# Patient Record
Sex: Male | Born: 1945 | Race: Black or African American | Hispanic: No | Marital: Married | Smoking: Former smoker
Health system: Southern US, Community
[De-identification: ages and names within clinical notes are randomized; demographics above are authoritative.]

## PROBLEM LIST (undated history)

## (undated) DIAGNOSIS — G709 Myoneural disorder, unspecified: Secondary | ICD-10-CM

## (undated) DIAGNOSIS — I10 Essential (primary) hypertension: Secondary | ICD-10-CM

## (undated) DIAGNOSIS — E119 Type 2 diabetes mellitus without complications: Secondary | ICD-10-CM

## (undated) DIAGNOSIS — R0600 Dyspnea, unspecified: Secondary | ICD-10-CM

## (undated) DIAGNOSIS — B20 Human immunodeficiency virus [HIV] disease: Secondary | ICD-10-CM

## (undated) DIAGNOSIS — N189 Chronic kidney disease, unspecified: Secondary | ICD-10-CM

## (undated) DIAGNOSIS — Z21 Asymptomatic human immunodeficiency virus [HIV] infection status: Secondary | ICD-10-CM

## (undated) DIAGNOSIS — M1990 Unspecified osteoarthritis, unspecified site: Secondary | ICD-10-CM

## (undated) HISTORY — PX: COLONOSCOPY: SHX174

## (undated) HISTORY — PX: ELBOW SURGERY: SHX618

## (undated) HISTORY — PX: LUMBAR SPINE SURGERY: SHX701

---

## 1997-09-03 ENCOUNTER — Other Ambulatory Visit: Admission: RE | Admit: 1997-09-03 | Discharge: 1997-09-03 | Payer: Self-pay | Admitting: Urology

## 1999-04-26 ENCOUNTER — Ambulatory Visit (HOSPITAL_COMMUNITY)
Admission: RE | Admit: 1999-04-26 | Discharge: 1999-04-26 | Payer: Self-pay | Admitting: Physical Medicine and Rehabilitation

## 1999-04-26 ENCOUNTER — Encounter: Payer: Self-pay | Admitting: Physical Medicine and Rehabilitation

## 2002-08-26 ENCOUNTER — Encounter: Admission: RE | Admit: 2002-08-26 | Discharge: 2002-11-24 | Payer: Self-pay | Admitting: Family Medicine

## 2008-12-16 ENCOUNTER — Encounter
Admission: RE | Admit: 2008-12-16 | Discharge: 2008-12-16 | Payer: Self-pay | Admitting: Physical Medicine and Rehabilitation

## 2010-05-08 IMAGING — CT CT L SPINE W/ CM
3 of 9 series · 6 of 20 positions shown, 7 images · non-contrast
Comparison: None

CLINICAL DATA: Back pain

CT MYELOGRAPHY LUMBAR SPINE
TECHNIQUE: CT imaging of the lumbar spine was performed after
intrathecal contrast administration.  Multiplanar CT image
reconstructions were also generated.

[Series 2: l spine bone · axial · 0.27mm/px · z∈[-20,+47]mm · 2 of 82 slices shown, 3 images]
[im 28/82  soft-tissue]
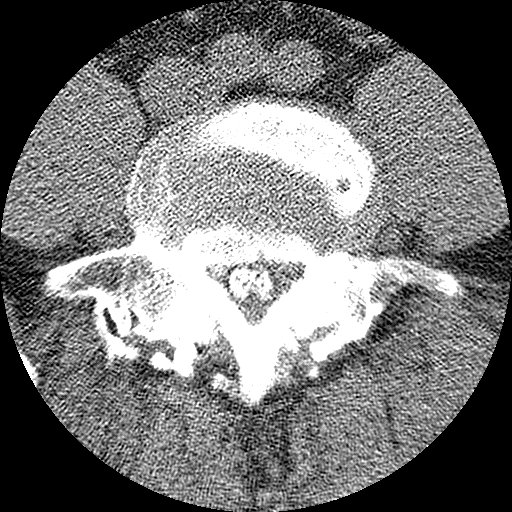
[im 28/82  bone]
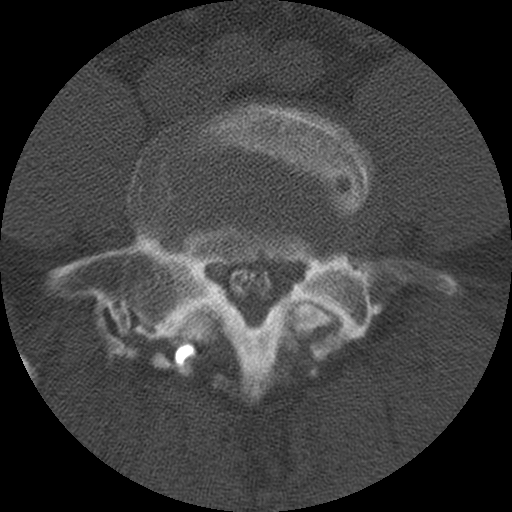
[im 55/82  bone]
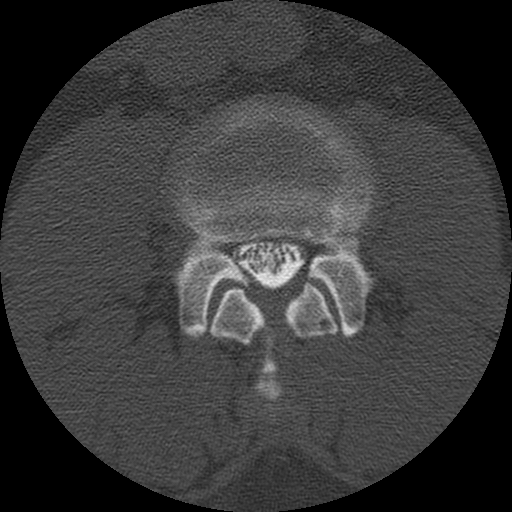

[Series 3: l spine soft · axial · 0.27mm/px · z∈[-38,+62]mm · 3 of 82 slices shown]
[im 21/82  soft-tissue]
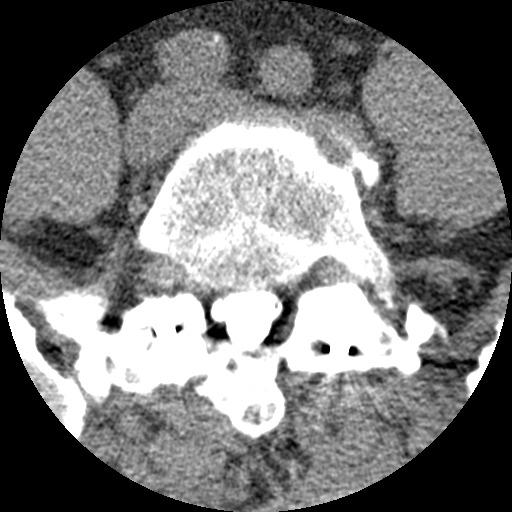
[im 41/82  soft-tissue]
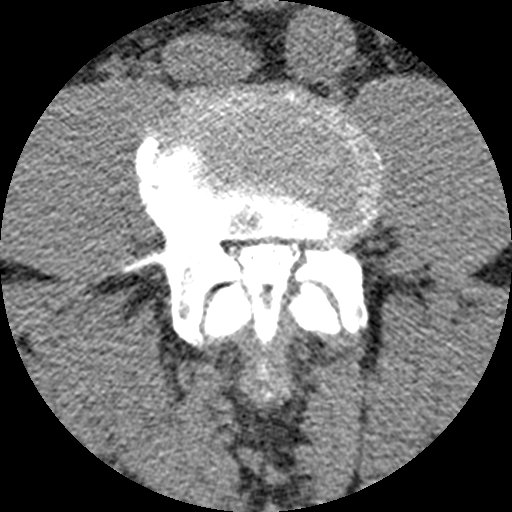
[im 61/82  soft-tissue]
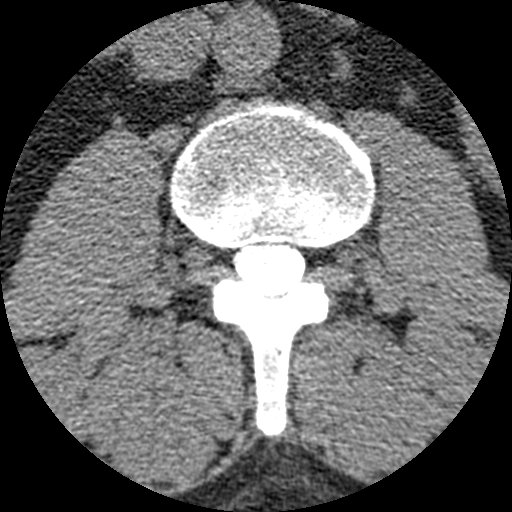

[Series 400: coronal · coronal · 0.41mm/px · 1 of 48 slices shown]
[im 24/48  bone]
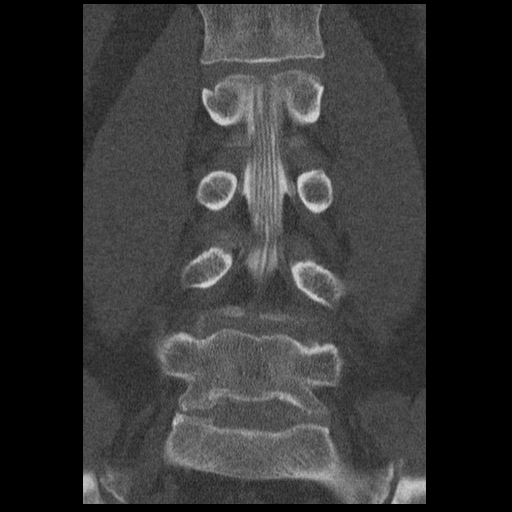

[6 of 20 positions shown; findings below may reference images not displayed]

FINDINGS: 2 mm anterolisthesis at L4-5.  No vertebral height loss.
Conus medullaris terminates at lower L2 vertebral body level.

L1-2:  Unremarkable.

L2-3:  Unremarkable.

L3-4:  Short pedicles.  Right paracentral, foraminal, and
extraforaminal disc protrusion results in moderate right lateral
recess narrowing and L4 nerve root encroachment.  Moderate right
foraminal narrowing as well.  Minimal facet arthropathy.

L4-5:  Ligamentum flavum hypertrophy, significant concentric disc
bulge, and short pedicles result in severe central stenosis.  Mild
by foraminal narrowing.

L5-S1:  Solid fusion across the posterior elements is present.
Minimal posterior disc osteophytes without significant lateral
recess narrowing. Patent foramina.
IMPRESSION: Severe central stenosis at L4-5.

Moderate right lateral recess and foraminal narrowing at L3-4.

Solid posterior fusion at L5-S1 without complication.

## 2013-02-06 DIAGNOSIS — C801 Malignant (primary) neoplasm, unspecified: Secondary | ICD-10-CM

## 2013-02-06 HISTORY — PX: LOBECTOMY: SHX5089

## 2013-02-06 HISTORY — PX: TONSILLECTOMY: SUR1361

## 2013-02-06 HISTORY — DX: Malignant (primary) neoplasm, unspecified: C80.1

## 2017-04-23 NOTE — Progress Notes (Deleted)
[facility] Note  04/24/2017     CHIEF COMPLAINT Patient presents for No chief complaint on file.   HISTORY OF PRESENT ILLNESS: Levi Koch is a 72 y.o. male who presents to the clinic today for:     Referring physician: [facility], [address] [place], [place] [id number]  HISTORICAL INFORMATION:   Selected notes from the [place] Referred by [facility] for DM exam;  LEE-  Ocular Hx-  PMH- DM (A1C 6.9 on 02.22.19), PTSD, hx lung ca, HIV+, HTN,     CURRENT MEDICATIONS: No current outpatient medications on file. (Ophthalmic Drugs)   No current facility-administered medications for this visit.  (Ophthalmic Drugs)   No current outpatient medications on file. (Other)   No current facility-administered medications for this visit.  (Other)      REVIEW OF SYSTEMS:    ALLERGIES Allergies not on file  PAST MEDICAL HISTORY No past medical history on file. *** The histories are not reviewed yet. Please review them in the "History" navigator section and refresh this [place].  FAMILY HISTORY No family history on file.  SOCIAL HISTORY Social History   Tobacco Use   Smoking status: Not on file  Substance Use Topics   Alcohol use: Not on file   Drug use: Not on file         OPHTHALMIC EXAM:   Not recorded      IMAGING AND PROCEDURES  Imaging and Procedures for 04/23/17           ASSESSMENT/PLAN:    ICD-10-CM   1. Retinal edema H35.81 OCT, Retina - OU - Both Eyes    1.  2.  3.  Ophthalmic Meds Ordered this visit:  No orders of the defined types were placed in this encounter.      No Follow-up on file.  There are no Patient Instructions on file for this visit.   Explained the diagnoses, plan, and follow up with the patient and they expressed understanding.  Patient expressed understanding of the importance of proper follow up care.   This document serves as a record  of services personally performed by Gardiner Sleeper, MD, PhD. It was created on their behalf by Catha Brow, [place], a certified ophthalmic assistant. The creation of this record is the provider's dictation and/or activities during the visit.  Electronically signed by: Catha Brow, [place]  04/23/17 4:46 PM    Gardiner Sleeper, M.D., Ph.D. Diseases & Surgery of the Retina and [place] 04/23/17     Abbreviations: M myopia (nearsighted); A astigmatism; H hyperopia (farsighted); P presbyopia; Mrx spectacle prescription;  CTL contact lenses; OD right eye; OS left eye; OU both eyes  XT exotropia; ET esotropia; PEK punctate epithelial keratitis; PEE punctate epithelial erosions; DES dry eye syndrome; MGD meibomian gland dysfunction; ATs artificial tears; PFAT's preservative free artificial tears; [place] nuclear sclerotic cataract; PSC posterior subcapsular cataract; ERM epi-retinal membrane; PVD posterior vitreous detachment; RD retinal detachment; DM diabetes mellitus; DR diabetic retinopathy; NPDR non-proliferative diabetic retinopathy; PDR proliferative diabetic retinopathy; CSME clinically significant macular edema; DME diabetic macular edema; dbh dot blot hemorrhages; CWS cotton wool spot; POAG primary open angle glaucoma; C/D cup-to-disc ratio; HVF humphrey visual field; GVF goldmann visual field; OCT optical coherence tomography; IOP intraocular pressure; BRVO Branch retinal vein occlusion; CRVO central retinal vein occlusion; CRAO central retinal artery occlusion; BRAO branch retinal artery occlusion; RT retinal tear; SB scleral buckle;  PPV pars plana vitrectomy; VH Vitreous hemorrhage; PRP panretinal laser photocoagulation; IVK intravitreal kenalog; VMT vitreomacular traction; MH Macular hole;  NVD neovascularization of the disc; NVE neovascularization elsewhere; AREDS age related eye disease study; ARMD age related macular degeneration; POAG primary open angle  glaucoma; EBMD epithelial/anterior basement membrane dystrophy; ACIOL anterior chamber intraocular lens; IOL intraocular lens; PCIOL posterior chamber intraocular lens; Phaco/IOL phacoemulsification with intraocular lens placement; [place] photorefractive keratectomy; LASIK laser assisted in situ keratomileusis; HTN hypertension; DM diabetes mellitus; COPD chronic obstructive pulmonary disease

## 2017-04-24 ENCOUNTER — Encounter (INDEPENDENT_AMBULATORY_CARE_PROVIDER_SITE_OTHER): Payer: Self-pay | Admitting: Ophthalmology

## 2022-03-14 ENCOUNTER — Other Ambulatory Visit: Payer: Self-pay | Admitting: Neurosurgery

## 2022-04-05 ENCOUNTER — Encounter (HOSPITAL_COMMUNITY)
Admission: RE | Admit: 2022-04-05 | Discharge: 2022-04-05 | Disposition: A | Discharge status: Home / Self Care | Source: Ambulatory Visit | Attending: Neurosurgery | Admitting: Neurosurgery

## 2022-04-05 ENCOUNTER — Encounter (HOSPITAL_COMMUNITY): Payer: Self-pay

## 2022-04-05 ENCOUNTER — Other Ambulatory Visit: Payer: Self-pay

## 2022-04-05 VITALS — BP 155/81 | HR 97 | Temp 97.7°F | Resp 19 | Ht 75.0 in | Wt 265.9 lb

## 2022-04-05 DIAGNOSIS — E1122 Type 2 diabetes mellitus with diabetic chronic kidney disease: Secondary | ICD-10-CM | POA: Insufficient documentation

## 2022-04-05 DIAGNOSIS — Z01818 Encounter for other preprocedural examination: Secondary | ICD-10-CM | POA: Diagnosis present

## 2022-04-05 DIAGNOSIS — Z85118 Personal history of other malignant neoplasm of bronchus and lung: Secondary | ICD-10-CM | POA: Diagnosis not present

## 2022-04-05 DIAGNOSIS — I129 Hypertensive chronic kidney disease with stage 1 through stage 4 chronic kidney disease, or unspecified chronic kidney disease: Secondary | ICD-10-CM | POA: Insufficient documentation

## 2022-04-05 DIAGNOSIS — E119 Type 2 diabetes mellitus without complications: Secondary | ICD-10-CM

## 2022-04-05 HISTORY — DX: Chronic kidney disease, unspecified: N18.9

## 2022-04-05 HISTORY — DX: Type 2 diabetes mellitus without complications: E11.9

## 2022-04-05 HISTORY — DX: Essential (primary) hypertension: I10

## 2022-04-05 HISTORY — DX: Unspecified osteoarthritis, unspecified site: M19.90

## 2022-04-05 HISTORY — DX: Myoneural disorder, unspecified: G70.9

## 2022-04-05 HISTORY — DX: Dyspnea, unspecified: R06.00

## 2022-04-05 HISTORY — DX: Human immunodeficiency virus (HIV) disease: B20

## 2022-04-05 HISTORY — DX: Asymptomatic human immunodeficiency virus (hiv) infection status: Z21

## 2022-04-05 LAB — BASIC METABOLIC PANEL
Anion gap: 9 (ref 5–15)
BUN: 30 mg/dL — ABNORMAL HIGH (ref 8–23)
CO2: 27 mmol/L (ref 22–32)
Calcium: 9.5 mg/dL (ref 8.9–10.3)
Chloride: 101 mmol/L (ref 98–111)
Creatinine, Ser: 2.97 mg/dL — ABNORMAL HIGH (ref 0.61–1.24)
GFR, Estimated: 21 mL/min — ABNORMAL LOW (ref >60)
Glucose, Bld: 197 mg/dL — ABNORMAL HIGH (ref 70–99)
Potassium: 4.1 mmol/L (ref 3.5–5.1)
Sodium: 137 mmol/L (ref 135–145)

## 2022-04-05 LAB — SURGICAL PCR SCREEN
MRSA, PCR: NEGATIVE
Staphylococcus aureus: NEGATIVE

## 2022-04-05 LAB — CBC
HCT: 39.3 % (ref 39.0–52.0)
Hemoglobin: 12.9 g/dL — ABNORMAL LOW (ref 13.0–17.0)
MCH: 36.1 pg — ABNORMAL HIGH (ref 26.0–34.0)
MCHC: 32.8 g/dL (ref 30.0–36.0)
MCV: 110.1 fL — ABNORMAL HIGH (ref 80.0–100.0)
Platelets: 168 10*3/uL (ref 150–400)
RBC: 3.57 MIL/uL — ABNORMAL LOW (ref 4.22–5.81)
RDW: 15.4 % (ref 11.5–15.5)
WBC: 9.6 10*3/uL (ref 4.0–10.5)
nRBC: 0 % (ref 0.0–0.2)

## 2022-04-05 LAB — GLUCOSE, CAPILLARY: Glucose-Capillary: 192 mg/dL — ABNORMAL HIGH (ref 70–99)

## 2022-04-05 NOTE — Progress Notes (Signed)
PCP - Dr. Delene Ruffini [facility]) Cardiologist - Denies  PPM/ICD - Denies Device Orders - n/a Rep Notified - n/a  Chest x-ray - n/a EKG - 04/05/2022 Stress Test - Denies ECHO - Denies Cardiac Cath - Denies  Sleep Study - Denies CPAP - n/a  Pt is DM2. He checks his blood sugar about one per week. Normal fasting range is 136-140. CBG at pre-op appointment was 192. For lunch, pt had a bacon burger, fries, and an orange soda  Last dose of GLP1 agonist- n/a GLP1 instructions: n/a  Blood Thinner Instructions: n/a Aspirin Instructions: Pt was told to hold ASA 5-7 days. His last dose was February 27th  NPO  COVID TEST- n/a   Anesthesia review: Yes. Pt is new to [facility]. Records requested from PCP: Last Office Note, most recent lab work, most recent EKG tracing, any cardiac testing completed that is available.  Patient denies shortness of breath, fever, cough and chest pain at PAT appointment. Pt denies any respiratory illness/infection in the last two months.   All instructions explained to the patient, with a verbal understanding of the material. Patient agrees to go over the instructions while at home for a better understanding. Patient also instructed to self quarantine after being tested for COVID-19. The opportunity to ask questions was provided.

## 2022-04-05 NOTE — Pre-Procedure Instructions (Signed)
Surgical Instructions    Your procedure is scheduled on April 10, 2022.  Report to [facility] Main Entrance "A" at 8:00 A.M., then check in with the Admitting office.  Call this number if you have problems the morning of surgery:  [phone]  If you have any questions prior to your surgery date call [phone]: Open Monday-Friday 8am-4pm If you experience any cold or flu symptoms such as cough, fever, chills, shortness of breath, etc. between now and your scheduled surgery, please notify us at the above number.     Remember:  Do not eat or drink after midnight the night before your surgery      Take these medicines the morning of surgery with A SIP OF WATER:  AMLODIPINE  BIKTARVY  CARVEDILOL  CERITIZINE  FLUTICAS INHL DISK  GABAPENTIN  KETOTIFEN Eye Drops  TIOTROPIUM    May take these medicines IF NEEDED:  CARBOXYMETHYCELLULOSE Eye Drops  HYDROXYZINE    STOP taking aspirin starting today, February 28th.   As of today, STOP taking any Aleve, Naproxen, Ibuprofen, Motrin, Advil, Goody's, BC's, all herbal medications, fish oil, and all vitamins.    WHAT DO I DO ABOUT MY DIABETES MEDICATION?   STOP taking your EMPAGLIFLOZIN (Jardiance) three days prior to surgery. Your last dose will be February 29th.  DO NOT take your GLIPIZIDE the evening before surgery or the morning of surgery.   HOW TO MANAGE YOUR DIABETES BEFORE AND AFTER SURGERY  Why is it important to control my blood sugar before and after surgery? Improving blood sugar levels before and after surgery helps healing and can limit problems. A way of improving blood sugar control is eating a healthy diet by:  Eating less sugar and carbohydrates  Increasing activity/exercise  Talking with your doctor about reaching your blood sugar goals High blood sugars (greater than 180 mg/dL) can raise your risk of infections and slow your recovery, so you will need to focus on controlling your diabetes during the  weeks before surgery. Make sure that the doctor who takes care of your diabetes knows about your planned surgery including the date and location.  How do I manage my blood sugar before surgery? Check your blood sugar at least 4 times a day, starting 2 days before surgery, to make sure that the level is not too high or low.  Check your blood sugar the morning of your surgery when you wake up and every 2 hours until you get to the Short Stay unit.  If your blood sugar is less than 70 mg/dL, you will need to treat for low blood sugar: Do not take insulin. Treat a low blood sugar (less than 70 mg/dL) with  cup of clear juice (cranberry or apple), 4 glucose tablets, OR glucose gel. Recheck blood sugar in 15 minutes after treatment (to make sure it is greater than 70 mg/dL). If your blood sugar is not greater than 70 mg/dL on recheck, call [phone] for further instructions. Report your blood sugar to the short stay nurse when you get to Short Stay.  If you are admitted to the hospital after surgery: Your blood sugar will be checked by the staff and you will probably be given insulin after surgery (instead of oral diabetes medicines) to make sure you have good blood sugar levels. The goal for blood sugar control after surgery is 80-180 mg/dL.                      Do NOT  Smoke (Tobacco/Vaping) for 24 hours prior to your procedure.  If you use a CPAP at night, you may bring your mask/headgear for your overnight stay.   Contacts, glasses, piercing's, hearing aid's, dentures or partials may not be worn into surgery, please bring cases for these belongings.    For patients admitted to the hospital, discharge time will be determined by your treatment team.   Patients discharged the day of surgery will not be allowed to drive home, and someone needs to stay with them for 24 hours.  SURGICAL WAITING ROOM VISITATION Patients having surgery or a procedure may have no more than 2 support people in  the waiting area - these visitors may rotate.   Children under the age of 84 must have an adult with them who is not the patient. If the patient needs to stay at the hospital during part of their recovery, the visitor guidelines for inpatient rooms apply. Pre-op nurse will coordinate an appropriate time for 1 support person to accompany patient in pre-op.  This support person may not rotate.   Please refer to the [facility] website for the visitor guidelines for Inpatients (after your surgery is over and you are in a regular room).    Special instructions:   [place]- Preparing For Surgery  Before surgery, you can play an important role. Because skin is not sterile, your skin needs to be as free of germs as possible. You can reduce the number of germs on your skin by washing with CHG (chlorahexidine gluconate) Soap before surgery.  CHG is an antiseptic cleaner which kills germs and bonds with the skin to continue killing germs even after washing.    Oral Hygiene is also important to reduce your risk of infection.  Remember - BRUSH YOUR TEETH THE MORNING OF SURGERY WITH YOUR REGULAR TOOTHPASTE  Please do not use if you have an allergy to CHG or antibacterial soaps. If your skin becomes reddened/irritated stop using the CHG.  Do not shave (including legs and underarms) for at least 48 hours prior to first CHG shower. It is OK to shave your face.  Please follow these instructions carefully.   Shower the NIGHT BEFORE SURGERY and the MORNING OF SURGERY  If you chose to wash your hair, wash your hair first as usual with your normal shampoo.  After you shampoo, rinse your hair and body thoroughly to remove the shampoo.  Use CHG Soap as you would any other liquid soap. You can apply CHG directly to the skin and wash gently with a scrungie or a clean washcloth.   Apply the CHG Soap to your body ONLY FROM THE NECK DOWN.  Do not use on open wounds or open sores. Avoid contact with your eyes,  ears, mouth and genitals (private parts). Wash Face and genitals (private parts)  with your normal soap.   Wash thoroughly, paying special attention to the area where your surgery will be performed.  Thoroughly rinse your body with warm water from the neck down.  DO NOT shower/wash with your normal soap after using and rinsing off the CHG Soap.  Pat yourself dry with a CLEAN TOWEL.  Wear CLEAN PAJAMAS to bed the night before surgery  Place CLEAN SHEETS on your bed the night before your surgery  DO NOT SLEEP WITH PETS.   Day of Surgery: Take a shower with CHG soap. Do not wear jewelry or makeup Do not wear lotions, powders, perfumes/colognes, or deodorant. Do not shave 48 hours prior  to surgery.  Men may shave face and neck. Do not bring valuables to the hospital.  [facility] is not responsible for any belongings or valuables. Do not wear nail polish, gel polish, artificial nails, or any other type of covering on natural nails (fingers and toes) If you have artificial nails or gel coating that need to be removed by a nail salon, please have this removed prior to surgery. Artificial nails or gel coating may interfere with anesthesia's ability to adequately monitor your vital signs.  Wear Clean/Comfortable clothing the morning of surgery Remember to brush your teeth WITH YOUR REGULAR TOOTHPASTE.   Please read over the following fact sheets that you were given.    If you received a COVID test during your pre-op visit  it is requested that you wear a mask when out in public, stay away from anyone that may not be feeling well and notify your [place] if you develop symptoms. If you have been in contact with anyone that has tested positive in the last 10 days please notify you [place].

## 2022-04-06 ENCOUNTER — Encounter (HOSPITAL_COMMUNITY): Payer: Self-pay

## 2022-04-06 LAB — HEMOGLOBIN A1C
Hgb A1c MFr Bld: 6.8 % — ABNORMAL HIGH (ref 4.8–5.6)
Mean Plasma Glucose: 148 mg/dL

## 2022-04-06 NOTE — Anesthesia Preprocedure Evaluation (Addendum)
Anesthesia Evaluation  Patient identified by MRN, date of birth, ID band Patient awake    Reviewed: Allergy & Precautions, NPO status , Patient's Chart, lab work & pertinent test results, reviewed documented beta blocker date and time   History of Anesthesia Complications Negative for: history of anesthetic complications  Airway Mallampati: II  TM Distance: >3 FB Neck ROM: Full    Dental no notable dental hx. (+) Dental Advisory Given   Pulmonary former smoker Hx lung Ca, s/p thoracotomy   Pulmonary exam normal        Cardiovascular hypertension, Pt. on medications Normal cardiovascular exam     Neuro/Psych negative neurological ROS     GI/Hepatic negative GI ROS, Neg liver ROS,,,  Endo/Other  diabetes    Renal/GU Renal InsufficiencyRenal disease     Musculoskeletal negative musculoskeletal ROS (+)    Abdominal   Peds  Hematology  (+) HIV  Anesthesia Other Findings   Reproductive/Obstetrics                             Anesthesia Physical Anesthesia Plan  ASA: 3  Anesthesia Plan: General   Post-op Pain Management: Tylenol PO (pre-op)* and Toradol IV (intra-op)*   Induction: Intravenous  PONV Risk Score and Plan: 3 and Ondansetron, Dexamethasone and Diphenhydramine  Airway Management Planned: Oral ETT  Additional Equipment:   Intra-op Plan:   Post-operative Plan: Extubation in OR  Informed Consent: I have reviewed the patients History and Physical, chart, labs and discussed the procedure including the risks, benefits and alternatives for the proposed anesthesia with the patient or authorized representative who has indicated his/her understanding and acceptance.     Dental advisory given  Plan Discussed with: Anesthesiologist and CRNA  Anesthesia Plan Comments: (PAT note written 04/06/2022 by Myra Gianotti, PA-C.  )       Anesthesia Quick Evaluation

## 2022-04-06 NOTE — Progress Notes (Signed)
Anesthesia Chart Review:  Case: [id number] Date/Time: 04/10/22 0948   Procedure: Sublaminar decompression - L3-L4 - L4-L5 (Back) - 3C   Anesthesia type: General   Pre-op diagnosis: Stenosis   Location: [place] OR ROOM 18 / [place] OR   Surgeons: Kary Kos, MD       DISCUSSION: Patient is a 77 year old male scheduled for the above procedure.  History includes former smoker (quit 02/06/13), HTN, DM2, CKD (stage IV), HIV (on treatment since at least 2017), neuropathy (legs), lung cancer (SCC s/p LL lobectomy 2016, SBRT to RUL and LUL 05/2017), dyspnea, arthritis, spinal surgery.    He primarily received medical care through the [facility] and [place] locations. See PROVIDERS below for summary of last noted office visits with primary care, ID, and nephrology. HIV has been well controlled on Biktarvy. CKD stage IV has been stable and 04/05/22 Creatinine of 2.97 appears consistent with prior results. CT Chest on 11/22/21 showed stable findings without suspicious findings for local recurrence, or metastatic disease. A1c 6.8%. EKG showed NSR.   She denied chest pain shortness of breath at PAT RN visit.  He denied recent respiratory symptoms.  He reported last ASA 04/04/22.   Anesthesia team to evaluate on the day of surgery.  VS: BP (!) 155/81   Pulse 97   Temp 36.5 C   Resp 19   Ht '6\' 3"'$  (1.905 m)   Wt 120.6 kg   SpO2 99%   BMI 33.24 kg/m    PROVIDERS: Reginia Naas, MD is PCP. Last visit noted is from 08/30/21. Routine six month follow-up planned [facility])  Susa Raring, DO ID 09/26/21 [facility]). Last visit noted was on 09/26/21 for routine HIV follow-up. He had good tolerance of Biktary (TAF/FTC/BIC) with good virologic  suppression and stable immune constitution. Bactrim OI PPx was stopped  in August 2022 due to improved CD4 counts. Continue current ART and routine monitoring of HIV labs.   Renee Harder, MD is nephrologist Last visit noted is from 04/27/21. Creatinine 2.8 in January 2023.  Declining proteinuria. HTN and anemia stable. Given overall good stability of Creatinine over 4-5 years, six month follow-up planned.     LABS: Preoperative labs noted. BUN 30, Creatinine 2.97, eGFR 21 which is consistent with labs from the [place] (BUN 37, Cr 3.260, eGFR  19 on 03/13/22 and Creatinine 2.750 on 08/30/21. HIV-1 Viral load panel on 03/23/22 showed HIV 1 RNA not detectable, CD3+CD4+T4 [place] cells 387 #/volume (normal [phone]) and 29.76 cells/100 cells (normal 29-59) on 03/13/22. (all labs ordered are listed, but only abnormal results are displayed)  Labs Reviewed  GLUCOSE, CAPILLARY - Abnormal; Notable for the following components:      Result Value   Glucose-Capillary 192 (*)    All other components within normal limits  CBC - Abnormal; Notable for the following components:   RBC 3.57 (*)    Hemoglobin 12.9 (*)    MCV 110.1 (*)    MCH 36.1 (*)    All other components within normal limits  BASIC METABOLIC PANEL - Abnormal; Notable for the following components:   Glucose, Bld 197 (*)    BUN 30 (*)    Creatinine, Ser 2.97 (*)    GFR, Estimated 21 (*)    All other components within normal limits  HEMOGLOBIN A1C - Abnormal; Notable for the following components:   Hgb A1c MFr Bld 6.8 (*)    All other components within normal limits  SURGICAL PCR SCREEN     IMAGES: MRI  L-spine 12/08/21: Images in PACS/Can[facility].   CT Chest 11/22/21 [facility]): Impression: 1. Stable chest CT following left lower lobectomy and thoracic  radiation treatment. No interval findings suspicious for local  recurrence, or metastatic disease.    EKG: 04/05/22: NSR   CV: N/A  Past Medical History:  Diagnosis Date   Arthritis    Cancer ([place]) 2015   Lung Cancer   CKD (chronic kidney disease)    stage IV, followed at the [facility] (04/27/21)   Diabetes mellitus without complication ([place])    Type 2   Dyspnea    HIV (human immunodeficiency virus infection) ([place])    followed by [facility] (09/26/21)    Hypertension    Neuromuscular disorder ([place])    Neuropathy both legs    Past Surgical History:  Procedure Laterality Date   COLONOSCOPY     ELBOW SURGERY Right    in the 1980s   LOBECTOMY Left 2015   LUMBAR SPINE SURGERY     x2. first one 32, unsure about second one   TONSILLECTOMY  2015   Removed prior to lung surgery    MEDICATIONS:  amLODipine (NORVASC) 10 MG tablet   aspirin EC 81 MG tablet   atorvastatin (LIPITOR) 40 MG tablet   bictegravir-emtricitabine-tenofovir AF (BIKTARVY) 50-200-25 MG TABS tablet   carboxymethylcellulose (REFRESH PLUS) 0.5 % SOLN   carvedilol (COREG) 25 MG tablet   cetirizine (ZYRTEC) 10 MG tablet   clobetasol (TEMOVATE) 0.05 % external solution   clobetasol ointment (TEMOVATE) 0.05 %   empagliflozin (JARDIANCE) 25 MG TABS tablet   fluocinonide ointment (LIDEX) 0.05 %   fluticasone-salmeterol (ADVAIR) 100-50 MCG/ACT AEPB   gabapentin (NEURONTIN) 100 MG capsule   glipiZIDE (GLUCOTROL) 10 MG tablet   hydrochlorothiazide (HYDRODIURIL) 25 MG tablet   hydrOXYzine (ATARAX) 10 MG tablet   ketotifen (ZADITOR) 0.025 % ophthalmic solution   mirtazapine (REMERON) 15 MG tablet   sildenafil (VIAGRA) 100 MG tablet   SOAP & CLEANSERS EX   spironolactone (ALDACTONE) 25 MG tablet   Tiotropium Bromide Monohydrate 1.25 MCG/ACT AERS   white petrolatum ointment   No current facility-administered medications for this encounter.    Myra Gianotti, PA-C Surgical Short Stay/Anesthesiology [facility] Phone [phone] [facility] Phone [phone] 04/06/2022 1:45 PM

## 2022-04-10 ENCOUNTER — Ambulatory Visit (HOSPITAL_COMMUNITY)
Admission: RE | Admit: 2022-04-10 | Discharge: 2022-04-11 | Disposition: A | Discharge status: Home / Self Care | Attending: Neurosurgery | Admitting: Neurosurgery

## 2022-04-10 ENCOUNTER — Ambulatory Visit (HOSPITAL_COMMUNITY): Admitting: Vascular Surgery

## 2022-04-10 ENCOUNTER — Encounter (HOSPITAL_COMMUNITY): Payer: Self-pay | Admitting: Neurosurgery

## 2022-04-10 ENCOUNTER — Other Ambulatory Visit: Payer: Self-pay

## 2022-04-10 ENCOUNTER — Ambulatory Visit (HOSPITAL_COMMUNITY)
Admission: RE | Disposition: A | Discharge status: Home / Self Care | Payer: Self-pay | Source: Home / Self Care | Attending: Neurosurgery

## 2022-04-10 ENCOUNTER — Ambulatory Visit (HOSPITAL_BASED_OUTPATIENT_CLINIC_OR_DEPARTMENT_OTHER): Admitting: Anesthesiology

## 2022-04-10 ENCOUNTER — Ambulatory Visit (HOSPITAL_COMMUNITY): Payer: Medicare Other

## 2022-04-10 DIAGNOSIS — Z87891 Personal history of nicotine dependence: Secondary | ICD-10-CM | POA: Insufficient documentation

## 2022-04-10 DIAGNOSIS — I129 Hypertensive chronic kidney disease with stage 1 through stage 4 chronic kidney disease, or unspecified chronic kidney disease: Secondary | ICD-10-CM | POA: Insufficient documentation

## 2022-04-10 DIAGNOSIS — Z08 Encounter for follow-up examination after completed treatment for malignant neoplasm: Secondary | ICD-10-CM | POA: Insufficient documentation

## 2022-04-10 DIAGNOSIS — Z09 Encounter for follow-up examination after completed treatment for conditions other than malignant neoplasm: Secondary | ICD-10-CM | POA: Diagnosis not present

## 2022-04-10 DIAGNOSIS — E1122 Type 2 diabetes mellitus with diabetic chronic kidney disease: Secondary | ICD-10-CM | POA: Insufficient documentation

## 2022-04-10 DIAGNOSIS — I10 Essential (primary) hypertension: Secondary | ICD-10-CM | POA: Diagnosis not present

## 2022-04-10 DIAGNOSIS — Z85118 Personal history of other malignant neoplasm of bronchus and lung: Secondary | ICD-10-CM | POA: Diagnosis not present

## 2022-04-10 DIAGNOSIS — M48061 Spinal stenosis, lumbar region without neurogenic claudication: Secondary | ICD-10-CM | POA: Diagnosis not present

## 2022-04-10 DIAGNOSIS — N189 Chronic kidney disease, unspecified: Secondary | ICD-10-CM | POA: Diagnosis not present

## 2022-04-10 DIAGNOSIS — M5416 Radiculopathy, lumbar region: Secondary | ICD-10-CM | POA: Insufficient documentation

## 2022-04-10 DIAGNOSIS — M48062 Spinal stenosis, lumbar region with neurogenic claudication: Secondary | ICD-10-CM | POA: Diagnosis not present

## 2022-04-10 DIAGNOSIS — Z79899 Other long term (current) drug therapy: Secondary | ICD-10-CM | POA: Diagnosis not present

## 2022-04-10 DIAGNOSIS — E119 Type 2 diabetes mellitus without complications: Secondary | ICD-10-CM | POA: Diagnosis not present

## 2022-04-10 DIAGNOSIS — Z7984 Long term (current) use of oral hypoglycemic drugs: Secondary | ICD-10-CM | POA: Diagnosis not present

## 2022-04-10 DIAGNOSIS — Z21 Asymptomatic human immunodeficiency virus [HIV] infection status: Secondary | ICD-10-CM | POA: Diagnosis not present

## 2022-04-10 HISTORY — PX: LUMBAR LAMINECTOMY/DECOMPRESSION MICRODISCECTOMY: SHX5026

## 2022-04-10 LAB — GLUCOSE, CAPILLARY
Glucose-Capillary: 176 mg/dL — ABNORMAL HIGH (ref 70–99)
Glucose-Capillary: 170 mg/dL — ABNORMAL HIGH (ref 70–99)
Glucose-Capillary: 175 mg/dL — ABNORMAL HIGH (ref 70–99)
Glucose-Capillary: 194 mg/dL — ABNORMAL HIGH (ref 70–99)
Glucose-Capillary: 292 mg/dL — ABNORMAL HIGH (ref 70–99)

## 2022-04-10 SURGERY — LUMBAR LAMINECTOMY/DECOMPRESSION MICRODISCECTOMY 2 LEVELS
Anesthesia: General | Site: Back

## 2022-04-10 MED ORDER — ALUM & MAG HYDROXIDE-SIMETH 200-200-20 MG/5ML PO SUSP: 30.0000 mL | Freq: Four times a day (QID) | ORAL | Status: DC | PRN

## 2022-04-10 MED ORDER — PANTOPRAZOLE SODIUM 40 MG IV SOLR: 40.0000 mg | Freq: Every day | INTRAVENOUS | Status: DC

## 2022-04-10 MED ORDER — PANTOPRAZOLE SODIUM 40 MG PO TBEC
40.0000 mg | DELAYED_RELEASE_TABLET | Freq: Every day | ORAL | Status: DC
  Administered 2022-04-10: 40 mg via ORAL
  Filled 2022-04-10: qty 1

## 2022-04-10 MED ORDER — PROMETHAZINE HCL 25 MG/ML IJ SOLN
6.2500 mg | INTRAMUSCULAR | Status: DC | PRN
  Administered 2022-04-10: 12.5 mg via INTRAVENOUS

## 2022-04-10 MED ORDER — DEXAMETHASONE SODIUM PHOSPHATE 10 MG/ML IJ SOLN
INTRAMUSCULAR | Status: AC
  Filled 2022-04-10: qty 1

## 2022-04-10 MED ORDER — AMLODIPINE BESYLATE 10 MG PO TABS: 10.0000 mg | ORAL_TABLET | Freq: Every day | ORAL | Status: DC

## 2022-04-10 MED ORDER — CEFAZOLIN SODIUM-DEXTROSE 2-4 GM/100ML-% IV SOLN
2.0000 g | INTRAVENOUS | Status: AC
  Administered 2022-04-10: 2 g via INTRAVENOUS
  Filled 2022-04-10: qty 100

## 2022-04-10 MED ORDER — AMISULPRIDE (ANTIEMETIC) 5 MG/2ML IV SOLN
INTRAVENOUS | Status: AC
  Filled 2022-04-10: qty 4

## 2022-04-10 MED ORDER — ONDANSETRON HCL 4 MG/2ML IJ SOLN: 4.0000 mg | Freq: Four times a day (QID) | INTRAMUSCULAR | Status: DC | PRN

## 2022-04-10 MED ORDER — CYCLOBENZAPRINE HCL 10 MG PO TABS
10.0000 mg | ORAL_TABLET | Freq: Three times a day (TID) | ORAL | Status: DC | PRN
  Administered 2022-04-10: 10 mg via ORAL
  Filled 2022-04-10: qty 1

## 2022-04-10 MED ORDER — HYDROXYZINE HCL 50 MG/ML IM SOLN
50.0000 mg | Freq: Four times a day (QID) | INTRAMUSCULAR | Status: DC | PRN
  Filled 2022-04-10: qty 1

## 2022-04-10 MED ORDER — ACETAMINOPHEN 650 MG RE SUPP: 650.0000 mg | RECTAL | Status: DC | PRN

## 2022-04-10 MED ORDER — KETOTIFEN FUMARATE 0.025 % OP SOLN
1.0000 [drp] | Freq: Two times a day (BID) | OPHTHALMIC | Status: DC
  Filled 2022-04-10: qty 5

## 2022-04-10 MED ORDER — FENTANYL CITRATE (PF) 100 MCG/2ML IJ SOLN
25.0000 ug | INTRAMUSCULAR | Status: DC | PRN
  Administered 2022-04-10 (×3): 50 ug via INTRAVENOUS

## 2022-04-10 MED ORDER — MIRTAZAPINE 15 MG PO TABS: 15.0000 mg | ORAL_TABLET | Freq: Every evening | ORAL | Status: DC | PRN

## 2022-04-10 MED ORDER — AMLODIPINE BESYLATE 10 MG PO TABS
10.0000 mg | ORAL_TABLET | Freq: Every day | ORAL | Status: DC
  Administered 2022-04-11: 10 mg via ORAL
  Filled 2022-04-10: qty 1

## 2022-04-10 MED ORDER — THROMBIN (RECOMBINANT) 5000 UNITS EX SOLR: CUTANEOUS | Status: DC | PRN

## 2022-04-10 MED ORDER — CEFAZOLIN SODIUM-DEXTROSE 2-4 GM/100ML-% IV SOLN
2.0000 g | Freq: Three times a day (TID) | INTRAVENOUS | Status: AC
  Administered 2022-04-10 – 2022-04-11 (×2): 2 g via INTRAVENOUS
  Filled 2022-04-10 (×2): qty 100

## 2022-04-10 MED ORDER — FENTANYL CITRATE (PF) 100 MCG/2ML IJ SOLN
INTRAMUSCULAR | Status: AC
  Filled 2022-04-10: qty 2

## 2022-04-10 MED ORDER — KETOTIFEN FUMARATE 0.035 % OP SOLN
1.0000 [drp] | Freq: Two times a day (BID) | OPHTHALMIC | Status: DC
  Filled 2022-04-10: qty 5

## 2022-04-10 MED ORDER — HYDROCODONE-ACETAMINOPHEN 5-325 MG PO TABS
2.0000 | ORAL_TABLET | ORAL | Status: DC | PRN
  Administered 2022-04-10 – 2022-04-11 (×4): 2 via ORAL
  Filled 2022-04-10 (×4): qty 2

## 2022-04-10 MED ORDER — EPHEDRINE 5 MG/ML INJ
INTRAVENOUS | Status: AC
  Filled 2022-04-10: qty 5

## 2022-04-10 MED ORDER — ASPIRIN 81 MG PO TBEC
81.0000 mg | DELAYED_RELEASE_TABLET | Freq: Every day | ORAL | Status: DC
  Administered 2022-04-10 – 2022-04-11 (×2): 81 mg via ORAL
  Filled 2022-04-10 (×2): qty 1

## 2022-04-10 MED ORDER — CLOBETASOL PROPIONATE 0.05 % EX SOLN: 1.0000 | CUTANEOUS | Status: DC

## 2022-04-10 MED ORDER — LORATADINE 10 MG PO TABS
10.0000 mg | ORAL_TABLET | Freq: Every day | ORAL | Status: DC
  Administered 2022-04-10 – 2022-04-11 (×2): 10 mg via ORAL
  Filled 2022-04-10 (×2): qty 1

## 2022-04-10 MED ORDER — DEXAMETHASONE SODIUM PHOSPHATE 10 MG/ML IJ SOLN
INTRAMUSCULAR | Status: DC | PRN
  Administered 2022-04-10: 5 mg via INTRAVENOUS

## 2022-04-10 MED ORDER — LIDOCAINE-EPINEPHRINE 1 %-1:100000 IJ SOLN
INTRAMUSCULAR | Status: AC
  Filled 2022-04-10: qty 1

## 2022-04-10 MED ORDER — FENTANYL CITRATE (PF) 250 MCG/5ML IJ SOLN
INTRAMUSCULAR | Status: AC
  Filled 2022-04-10: qty 5

## 2022-04-10 MED ORDER — CHLORHEXIDINE GLUCONATE CLOTH 2 % EX PADS: 6.0000 | MEDICATED_PAD | Freq: Once | CUTANEOUS | Status: DC

## 2022-04-10 MED ORDER — CLOBETASOL PROPIONATE 0.05 % EX OINT: 1.0000 | TOPICAL_OINTMENT | Freq: Two times a day (BID) | CUTANEOUS | Status: DC | PRN

## 2022-04-10 MED ORDER — LIDOCAINE-EPINEPHRINE 1 %-1:100000 IJ SOLN
INTRAMUSCULAR | Status: DC | PRN
  Administered 2022-04-10: 10 mL

## 2022-04-10 MED ORDER — SPIRONOLACTONE 25 MG PO TABS
25.0000 mg | ORAL_TABLET | Freq: Every day | ORAL | Status: DC
  Administered 2022-04-10 – 2022-04-11 (×2): 25 mg via ORAL
  Filled 2022-04-10 (×2): qty 1

## 2022-04-10 MED ORDER — FENTANYL CITRATE (PF) 250 MCG/5ML IJ SOLN
INTRAMUSCULAR | Status: DC | PRN
  Administered 2022-04-10: 50 ug via INTRAVENOUS
  Administered 2022-04-10: 100 ug via INTRAVENOUS

## 2022-04-10 MED ORDER — HYDROXYZINE HCL 10 MG PO TABS: 10.0000 mg | ORAL_TABLET | Freq: Two times a day (BID) | ORAL | Status: DC | PRN

## 2022-04-10 MED ORDER — POLYVINYL ALCOHOL 1.4 % OP SOLN: 1.0000 [drp] | Freq: Two times a day (BID) | OPHTHALMIC | Status: DC | PRN

## 2022-04-10 MED ORDER — SILDENAFIL CITRATE 100 MG PO TABS: 100.0000 mg | ORAL_TABLET | Freq: Every day | ORAL | Status: DC | PRN

## 2022-04-10 MED ORDER — BICTEGRAVIR-EMTRICITAB-TENOFOV 50-200-25 MG PO TABS
1.0000 | ORAL_TABLET | Freq: Every day | ORAL | Status: DC
  Administered 2022-04-10 – 2022-04-11 (×2): 1 via ORAL
  Filled 2022-04-10 (×2): qty 1

## 2022-04-10 MED ORDER — MOMETASONE FURO-FORMOTEROL FUM 100-5 MCG/ACT IN AERO
2.0000 | INHALATION_SPRAY | Freq: Two times a day (BID) | RESPIRATORY_TRACT | Status: DC
  Filled 2022-04-10: qty 8.8

## 2022-04-10 MED ORDER — PROPOFOL 10 MG/ML IV BOLUS
INTRAVENOUS | Status: DC | PRN
  Administered 2022-04-10: 160 mg via INTRAVENOUS

## 2022-04-10 MED ORDER — LIDOCAINE 2% (20 MG/ML) 5 ML SYRINGE
INTRAMUSCULAR | Status: AC
  Filled 2022-04-10: qty 5

## 2022-04-10 MED ORDER — SODIUM CHLORIDE 0.9 % IV SOLN: INTRAVENOUS | Status: DC

## 2022-04-10 MED ORDER — PHENYLEPHRINE 80 MCG/ML (10ML) SYRINGE FOR IV PUSH (FOR BLOOD PRESSURE SUPPORT)
PREFILLED_SYRINGE | INTRAVENOUS | Status: DC | PRN
  Administered 2022-04-10: 80 ug via INTRAVENOUS

## 2022-04-10 MED ORDER — PROMETHAZINE HCL 25 MG/ML IJ SOLN
INTRAMUSCULAR | Status: AC
  Filled 2022-04-10: qty 1

## 2022-04-10 MED ORDER — 0.9 % SODIUM CHLORIDE (POUR BTL) OPTIME
TOPICAL | Status: DC | PRN
  Administered 2022-04-10: 1000 mL

## 2022-04-10 MED ORDER — THROMBIN 5000 UNITS EX SOLR
CUTANEOUS | Status: AC
  Filled 2022-04-10: qty 10000

## 2022-04-10 MED ORDER — BUPIVACAINE HCL (PF) 0.25 % IJ SOLN
INTRAMUSCULAR | Status: DC | PRN
  Administered 2022-04-10: 10 mL

## 2022-04-10 MED ORDER — ONDANSETRON HCL 4 MG/2ML IJ SOLN
INTRAMUSCULAR | Status: DC | PRN
  Administered 2022-04-10: 4 mg via INTRAVENOUS

## 2022-04-10 MED ORDER — GABAPENTIN 100 MG PO CAPS
200.0000 mg | ORAL_CAPSULE | Freq: Two times a day (BID) | ORAL | Status: DC
  Administered 2022-04-10 – 2022-04-11 (×2): 200 mg via ORAL
  Filled 2022-04-10 (×2): qty 2

## 2022-04-10 MED ORDER — LACTATED RINGERS IV SOLN: INTRAVENOUS | Status: DC

## 2022-04-10 MED ORDER — CYCLOBENZAPRINE HCL 10 MG PO TABS
ORAL_TABLET | ORAL | Status: AC
  Filled 2022-04-10: qty 1

## 2022-04-10 MED ORDER — CARVEDILOL 12.5 MG PO TABS
12.5000 mg | ORAL_TABLET | Freq: Two times a day (BID) | ORAL | Status: DC
  Administered 2022-04-10 – 2022-04-11 (×2): 12.5 mg via ORAL
  Filled 2022-04-10 (×2): qty 1

## 2022-04-10 MED ORDER — LIDOCAINE 2% (20 MG/ML) 5 ML SYRINGE
INTRAMUSCULAR | Status: DC | PRN
  Administered 2022-04-10: 100 mg via INTRAVENOUS

## 2022-04-10 MED ORDER — ACETAMINOPHEN 325 MG PO TABS: 650.0000 mg | ORAL_TABLET | ORAL | Status: DC | PRN

## 2022-04-10 MED ORDER — INSULIN ASPART 100 UNIT/ML IJ SOLN
0.0000 [IU] | Freq: Three times a day (TID) | INTRAMUSCULAR | Status: DC
  Administered 2022-04-10: 3 [IU] via SUBCUTANEOUS

## 2022-04-10 MED ORDER — ORAL CARE MOUTH RINSE: 15.0000 mL | Freq: Once | OROMUCOSAL | Status: AC

## 2022-04-10 MED ORDER — ROCURONIUM BROMIDE 10 MG/ML (PF) SYRINGE
PREFILLED_SYRINGE | INTRAVENOUS | Status: AC
  Filled 2022-04-10: qty 10

## 2022-04-10 MED ORDER — SODIUM CHLORIDE 0.9 % IV SOLN
250.0000 mL | INTRAVENOUS | Status: DC
  Administered 2022-04-10: 250 mL via INTRAVENOUS

## 2022-04-10 MED ORDER — SUGAMMADEX SODIUM 200 MG/2ML IV SOLN
INTRAVENOUS | Status: DC | PRN
  Administered 2022-04-10: 300 mg via INTRAVENOUS

## 2022-04-10 MED ORDER — ACETAMINOPHEN 500 MG PO TABS
1000.0000 mg | ORAL_TABLET | Freq: Once | ORAL | Status: AC
  Administered 2022-04-10: 1000 mg via ORAL
  Filled 2022-04-10: qty 2

## 2022-04-10 MED ORDER — UMECLIDINIUM BROMIDE 62.5 MCG/ACT IN AEPB
1.0000 | INHALATION_SPRAY | Freq: Every day | RESPIRATORY_TRACT | Status: DC
  Filled 2022-04-10: qty 7

## 2022-04-10 MED ORDER — INSULIN ASPART 100 UNIT/ML IJ SOLN: 0.0000 [IU] | INTRAMUSCULAR | Status: DC | PRN

## 2022-04-10 MED ORDER — EMPAGLIFLOZIN 10 MG PO TABS
10.0000 mg | ORAL_TABLET | Freq: Every day | ORAL | Status: DC
  Administered 2022-04-10 – 2022-04-11 (×2): 10 mg via ORAL
  Filled 2022-04-10 (×2): qty 1

## 2022-04-10 MED ORDER — SODIUM CHLORIDE 0.9% FLUSH: 3.0000 mL | Freq: Two times a day (BID) | INTRAVENOUS | Status: DC

## 2022-04-10 MED ORDER — AMISULPRIDE (ANTIEMETIC) 5 MG/2ML IV SOLN
10.0000 mg | Freq: Once | INTRAVENOUS | Status: AC | PRN
  Administered 2022-04-10: 10 mg via INTRAVENOUS

## 2022-04-10 MED ORDER — TIOTROPIUM BROMIDE MONOHYDRATE 1.25 MCG/ACT IN AERS: 2.0000 | INHALATION_SPRAY | Freq: Every day | RESPIRATORY_TRACT | Status: DC

## 2022-04-10 MED ORDER — BUPIVACAINE HCL (PF) 0.25 % IJ SOLN
INTRAMUSCULAR | Status: AC
  Filled 2022-04-10: qty 30

## 2022-04-10 MED ORDER — PROPOFOL 10 MG/ML IV BOLUS
INTRAVENOUS | Status: AC
  Filled 2022-04-10: qty 20

## 2022-04-10 MED ORDER — CHLORHEXIDINE GLUCONATE 0.12 % MT SOLN
15.0000 mL | Freq: Once | OROMUCOSAL | Status: AC
  Administered 2022-04-10: 15 mL via OROMUCOSAL
  Filled 2022-04-10: qty 15

## 2022-04-10 MED ORDER — GLIPIZIDE 10 MG PO TABS
10.0000 mg | ORAL_TABLET | Freq: Two times a day (BID) | ORAL | Status: DC
  Administered 2022-04-10 – 2022-04-11 (×2): 10 mg via ORAL
  Filled 2022-04-10 (×3): qty 1

## 2022-04-10 MED ORDER — INSULIN ASPART 100 UNIT/ML IJ SOLN: 0.0000 [IU] | Freq: Three times a day (TID) | INTRAMUSCULAR | Status: DC

## 2022-04-10 MED ORDER — MENTHOL 3 MG MT LOZG: 1.0000 | LOZENGE | OROMUCOSAL | Status: DC | PRN

## 2022-04-10 MED ORDER — ROCURONIUM BROMIDE 10 MG/ML (PF) SYRINGE
PREFILLED_SYRINGE | INTRAVENOUS | Status: DC | PRN
  Administered 2022-04-10: 70 mg via INTRAVENOUS
  Administered 2022-04-10: 10 mg via INTRAVENOUS

## 2022-04-10 MED ORDER — HYDROCHLOROTHIAZIDE 25 MG PO TABS
25.0000 mg | ORAL_TABLET | Freq: Every day | ORAL | Status: DC
  Administered 2022-04-10: 25 mg via ORAL
  Filled 2022-04-10 (×2): qty 1

## 2022-04-10 MED ORDER — PHENOL 1.4 % MT LIQD: 1.0000 | OROMUCOSAL | Status: DC | PRN

## 2022-04-10 MED ORDER — ONDANSETRON HCL 4 MG PO TABS: 4.0000 mg | ORAL_TABLET | Freq: Four times a day (QID) | ORAL | Status: DC | PRN

## 2022-04-10 MED ORDER — SODIUM CHLORIDE 0.9% FLUSH: 3.0000 mL | INTRAVENOUS | Status: DC | PRN

## 2022-04-10 MED ORDER — ONDANSETRON HCL 4 MG/2ML IJ SOLN
INTRAMUSCULAR | Status: AC
  Filled 2022-04-10: qty 2

## 2022-04-10 MED ORDER — HYDROMORPHONE HCL 1 MG/ML IJ SOLN: 0.5000 mg | INTRAMUSCULAR | Status: DC | PRN

## 2022-04-10 MED ORDER — ATORVASTATIN CALCIUM 40 MG PO TABS
40.0000 mg | ORAL_TABLET | Freq: Every day | ORAL | Status: DC
  Administered 2022-04-10: 40 mg via ORAL
  Filled 2022-04-10: qty 1

## 2022-04-10 MED ORDER — FLUOCINONIDE 0.05 % EX OINT: 1.0000 | TOPICAL_OINTMENT | Freq: Two times a day (BID) | CUTANEOUS | Status: DC | PRN

## 2022-04-10 SURGICAL SUPPLY — 55 items
ADH SKN CLS APL DERMABOND .7 (GAUZE/BANDAGES/DRESSINGS) ×1
APL SKNCLS STERI-STRIP NONHPOA (GAUZE/BANDAGES/DRESSINGS) ×1
BAG COUNTER SPONGE SURGICOUNT (BAG) ×1 IMPLANT
BAG SPNG CNTER NS LX DISP (BAG) ×1
BAND INSRT 18 STRL LF DISP RB (MISCELLANEOUS) ×2
BAND RUBBER #18 3X1/16 STRL (MISCELLANEOUS) ×2 IMPLANT
BENZOIN TINCTURE PRP APPL 2/3 (GAUZE/BANDAGES/DRESSINGS) ×1 IMPLANT
BLADE CLIPPER SURG (BLADE) IMPLANT
BLADE SURG 11 STRL SS (BLADE) ×1 IMPLANT
BUR CUTTER 7.0 ROUND (BURR) ×1 IMPLANT
BUR MATCHSTICK NEURO 3.0 LAGG (BURR) ×1 IMPLANT
CANISTER SUCT 3000ML PPV (MISCELLANEOUS) ×1 IMPLANT
DERMABOND ADVANCED .7 DNX12 (GAUZE/BANDAGES/DRESSINGS) ×1 IMPLANT
DRAPE HALF SHEET 40X57 (DRAPES) IMPLANT
DRAPE LAPAROTOMY 100X72X124 (DRAPES) ×1 IMPLANT
DRAPE MICROSCOPE SLANT 54X150 (MISCELLANEOUS) ×1 IMPLANT
DRAPE SURG 17X23 STRL (DRAPES) ×1 IMPLANT
DRSG OPSITE 4X5.5 SM (GAUZE/BANDAGES/DRESSINGS) IMPLANT
DRSG OPSITE POSTOP 4X8 (GAUZE/BANDAGES/DRESSINGS) IMPLANT
DURAPREP 26ML APPLICATOR (WOUND CARE) ×1 IMPLANT
ELECT BLADE 4.0 EZ CLEAN MEGAD (MISCELLANEOUS) ×1
ELECT REM PT RETURN 9FT ADLT (ELECTROSURGICAL) ×1
ELECTRODE BLDE 4.0 EZ CLN MEGD (MISCELLANEOUS) IMPLANT
ELECTRODE REM PT RTRN 9FT ADLT (ELECTROSURGICAL) ×1 IMPLANT
EVACUATOR 1/8 PVC DRAIN (DRAIN) IMPLANT
GAUZE 4X4 16PLY ~~LOC~~+RFID DBL (SPONGE) IMPLANT
GAUZE SPONGE 4X4 12PLY STRL (GAUZE/BANDAGES/DRESSINGS) ×1 IMPLANT
GLOVE BIO SURGEON STRL SZ7 (GLOVE) IMPLANT
GLOVE BIO SURGEON STRL SZ8 (GLOVE) ×1 IMPLANT
GLOVE BIOGEL PI IND STRL 7.0 (GLOVE) IMPLANT
GLOVE EXAM NITRILE XL STR (GLOVE) IMPLANT
GLOVE INDICATOR 8.5 STRL (GLOVE) ×2 IMPLANT
GOWN STRL REUS W/ TWL LRG LVL3 (GOWN DISPOSABLE) ×1 IMPLANT
GOWN STRL REUS W/ TWL XL LVL3 (GOWN DISPOSABLE) ×2 IMPLANT
GOWN STRL REUS W/TWL 2XL LVL3 (GOWN DISPOSABLE) IMPLANT
GOWN STRL REUS W/TWL LRG LVL3 (GOWN DISPOSABLE) ×2
GOWN STRL REUS W/TWL XL LVL3 (GOWN DISPOSABLE) ×1
KIT BASIN OR (CUSTOM PROCEDURE TRAY) ×1 IMPLANT
KIT TURNOVER KIT B (KITS) ×1 IMPLANT
NDL SPNL 22GX3.5 QUINCKE BK (NEEDLE) ×1 IMPLANT
NEEDLE HYPO 22GX1.5 SAFETY (NEEDLE) ×1 IMPLANT
NEEDLE SPNL 22GX3.5 QUINCKE BK (NEEDLE) ×1 IMPLANT
NS IRRIG 1000ML POUR BTL (IV SOLUTION) ×1 IMPLANT
PACK LAMINECTOMY NEURO (CUSTOM PROCEDURE TRAY) ×1 IMPLANT
PATTIES SURGICAL .5 X.5 (GAUZE/BANDAGES/DRESSINGS) IMPLANT
SPIKE FLUID TRANSFER (MISCELLANEOUS) ×1 IMPLANT
SPONGE SURGIFOAM ABS GEL SZ50 (HEMOSTASIS) ×1 IMPLANT
STRIP CLOSURE SKIN 1/2X4 (GAUZE/BANDAGES/DRESSINGS) ×1 IMPLANT
SUT VIC AB 0 CT1 18XCR BRD8 (SUTURE) ×1 IMPLANT
SUT VIC AB 0 CT1 8-18 (SUTURE) ×1
SUT VIC AB 2-0 CT1 18 (SUTURE) ×1 IMPLANT
SUT VICRYL 4-0 PS2 18IN ABS (SUTURE) ×1 IMPLANT
TOWEL GREEN STERILE (TOWEL DISPOSABLE) ×1 IMPLANT
TOWEL GREEN STERILE FF (TOWEL DISPOSABLE) ×1 IMPLANT
WATER STERILE IRR 1000ML POUR (IV SOLUTION) ×1 IMPLANT

## 2022-04-10 NOTE — Op Note (Signed)
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  Postoperative diagnosis: Same  Procedure: Decompressive laminectomies L3-4 L4-5 partial medial facetectomies and foraminotomies of the L3, L4 and L5 nerve roots bilaterally.  Buzzelli: Levi Koch.  Assistant: Nash Shearer.  Anesthesia: General.  EBL: Minimal.  HPI: 77 year old gentleman with severe hip and leg pain rating down L4 distribution workup revealed severe spinal stenosis at L3-4 and L4-5.  Due to patient's progression of clinical syndrome imaging findings of a conservative treatment I recommended decompressive laminectomy at those 2 levels.  I extensively reviewed the risks and benefits of the operation with him as well as perioperative course expectations of outcome and alternatives of surgery and he understood and agreed to proceed forward.  Operative procedure: Patient was brought into the OR was induced under general anesthesia positioned prone Wilson frame his back was prepped and draped in routine sterile fashion his old incision was opened up the scar tissue was dissected free subperiosteal dissection was carried lamina of L3-L4 and L5 bilaterally interoperative x-ray confirmed identification of the appropriate level.  Then the spinous process at L3 and L4 was removed central decompression was begun the lamina was thinned out with a high-speed drill and central decompression was begun with a 2 and 3 Miller Kerrison punch.  Marked hourglass compression of thecal sac was causing severe stenosis at 3 4 and 4 5 so using a 4 Penfield the dura was dissected off with a large medially projecting spurs and then the facet joints and lateral gutters were under Bitton with a 2 mm Kerrison punch foraminotomies were performed of the L3-L4 and L5 nerve roots initially on the left and subsequently on the right.  At the end of decompression there is no further stenosis all the  foramina were widely patent the wounds and copiously irrigated meticulous hemostasis was maintained Gelfoam was ON top of the dura a medium Hemovac drain was placed Marcaine was injected into the fascia and the muscle and connective tissue were closed with interrupted Vicryl and a running 4 subcuticular and the skin Dermabond benzoin Steri-Strips and a sterile dressing was applied patient recovery in stable condition.  At the end of the case all needle counts and sponge counts were correct.

## 2022-04-10 NOTE — Anesthesia Procedure Notes (Signed)
Procedure Name: Intubation Date/Time: 04/10/2022 11:22 AM  Performed by: Gaylene Brooks, CRNAPre-anesthesia Checklist: Patient identified, Emergency Drugs available, Suction available and Patient being monitored Patient Re-evaluated:Patient Re-evaluated prior to induction Oxygen Delivery Method: Circle System Utilized Preoxygenation: Pre-oxygenation with 100% oxygen Induction Type: IV induction Ventilation: Mask ventilation without difficulty Laryngoscope Size: Miller and 2 Grade View: Grade II Tube type: Oral Tube size: 7.5 mm Number of attempts: 1 Airway Equipment and Method: Stylet and Oral airway Placement Confirmation: ETT inserted through vocal cords under direct vision, positive ETCO2 and breath sounds checked- equal and bilateral Secured at: 22 cm Tube secured with: Tape Dental Injury: Teeth and Oropharynx as per pre-operative assessment

## 2022-04-10 NOTE — H&P (Signed)
Levi Koch is an 77 y.o. male.   Chief Complaint: Hip and leg pain neurogenic claudication HPI: 77 year old gentleman with neurogenic claudication and hip and leg pain consistent with primarily L4 distribution workup showed severe spinal stenosis at L3-4 and L4-5 he has previously gone solid L5-S1 trans facet fusion however he is got no back pain and no evidence of overt instability and progressive worsening claudication.  Due to his progression of clinical syndrome imaging findings for conservative treatment I have recommended decompressive laminectomies at L3-4 and through the superior aspect of 4 5.  I extensively the risks and benefits including the fact that we are doing this above the level of the previous fusion and there is some risk however no evident overt evidence of instability and with claudication being his predominant symptom I think that we can give the decompression a chance due to medical comorbidities it is the best option I think for him.  Risks benefits perioperative course expectations of outcome and alternatives were all explained extensively he understands  Past Medical History:  Diagnosis Date   Arthritis    Cancer ([place]) 2015   Lung Cancer   CKD (chronic kidney disease)    stage IV, followed at the [facility] (04/27/21)   Diabetes mellitus without complication ([place])    Type 2   Dyspnea    HIV (human immunodeficiency virus infection) ([place])    followed by [facility] (09/26/21)   Hypertension    Neuromuscular disorder ([place])    Neuropathy both legs    Past Surgical History:  Procedure Laterality Date   COLONOSCOPY     ELBOW SURGERY Right    in the 1980s   LOBECTOMY Left 2015   LUMBAR SPINE SURGERY     x2. first one 57, unsure about second one   TONSILLECTOMY  2015   Removed prior to lung surgery    History reviewed. No pertinent family history. Social History:  reports that he quit smoking about 9 years ago. His smoking use included cigarettes. He has never used  smokeless tobacco. He reports that he does not drink alcohol and does not use drugs.  Allergies: No Known Allergies  Medications Prior to Admission  Medication Sig Dispense Refill   amLODipine (NORVASC) 10 MG tablet Take 10 mg by mouth daily.     aspirin EC 81 MG tablet Take 81 mg by mouth daily.     atorvastatin (LIPITOR) 40 MG tablet Take 40 mg by mouth at bedtime.     bictegravir-emtricitabine-tenofovir AF (BIKTARVY) 50-200-25 MG TABS tablet Take 1 tablet by mouth daily.     carboxymethylcellulose (REFRESH PLUS) 0.5 % SOLN Place 1 drop into both eyes 2 (two) times daily as needed (dry eyes).     carvedilol (COREG) 25 MG tablet Take 12.5 mg by mouth 2 (two) times daily.     cetirizine (ZYRTEC) 10 MG tablet Take 10 mg by mouth daily.     clobetasol (TEMOVATE) 0.05 % external solution Apply 1 Application topically See admin instructions. Apply topically twice daily for 2 weeks on, 1 week off as needed for flares. Mix in 16 oz of Cerave moisturizer.     clobetasol ointment (TEMOVATE) [id number] % Apply 1 Application topically See admin instructions. Apply topically twice daily for 2 weeks on, 1 week off as needed for flares.     empagliflozin (JARDIANCE) 25 MG TABS tablet Take 12.5 mg by mouth daily.     fluocinonide ointment (LIDEX) [id number] % Apply 1 Application topically See admin  instructions. Apply topically twice daily for 7 days on, 7 days off. Use as needed for flares.     fluticasone-salmeterol (ADVAIR) 100-50 MCG/ACT AEPB Inhale 1 puff into the lungs 2 (two) times daily.     gabapentin (NEURONTIN) 100 MG capsule Take 200 mg by mouth 2 (two) times daily.     glipiZIDE (GLUCOTROL) 10 MG tablet Take 10 mg by mouth 2 (two) times daily before a meal.     hydrochlorothiazide (HYDRODIURIL) 25 MG tablet Take 25 mg by mouth daily.     hydrOXYzine (ATARAX) 10 MG tablet Take 10 mg by mouth 2 (two) times daily as needed for itching.     ketotifen (ZADITOR) 0.025 % ophthalmic solution Place 1 drop into both  eyes 2 (two) times daily.     mirtazapine (REMERON) 15 MG tablet Take 15 mg by mouth at bedtime as needed (sleep).     SOAP & CLEANSERS EX Apply 1 application  topically daily. Cleansing soap bar, sensitive skin.     spironolactone (ALDACTONE) 25 MG tablet Take 25 mg by mouth daily.     Tiotropium Bromide Monohydrate 1.25 MCG/ACT AERS Inhale 2 each into the lungs daily.     white petrolatum ointment Apply 1 application  topically 2 (two) times daily as needed for dry skin.     sildenafil (VIAGRA) 100 MG tablet Take 100 mg by mouth daily as needed for erectile dysfunction.      Results for orders placed or performed during the hospital encounter of 04/10/22 (from the past 48 hour(s))  Glucose, capillary     Status: Abnormal   Collection Time: 04/10/22  8:08 AM  Result Value Ref Range   Glucose-Capillary 176 (H) 70 - 99 mg/dL    Comment: Glucose reference range applies only to samples taken after fasting for at least 8 hours.   Comment 1 Notify RN    Comment 2 Document in Chart   Glucose, capillary     Status: Abnormal   Collection Time: 04/10/22 10:01 AM  Result Value Ref Range   Glucose-Capillary 170 (H) 70 - 99 mg/dL    Comment: Glucose reference range applies only to samples taken after fasting for at least 8 hours.   No results found.  Review of Systems  Musculoskeletal:  Positive for gait problem.  Neurological:  Positive for numbness.    Blood pressure (!) 151/88, pulse 77, temperature (!) 97.3 F (36.3 C), temperature source Oral, resp. rate 20, height '6\' 3"'$  (1.905 m), weight 120.2 kg, SpO2 100 %. Physical Exam HENT:     Right Ear: Tympanic membrane normal.     Nose: Nose normal.  Eyes:     Pupils: Pupils are equal, round, and reactive to light.  Cardiovascular:     Rate and Rhythm: Normal rate.  Pulmonary:     Effort: Pulmonary effort is normal.  Abdominal:     General: Abdomen is flat.  Musculoskeletal:        General: Normal range of motion.  Skin:    General:  Skin is warm.  Neurological:     Mental Status: He is alert.     Comments: Strength 5-5 iliopsoas, quads, hamstrings, gastroc, tibialis, and EHL.      Assessment/Plan 77 year old presents for decompressive laminectomy L3-4 L4-5  Elaina Hoops, MD 04/10/2022, 10:42 AM

## 2022-04-10 NOTE — Transfer of Care (Signed)
Immediate Anesthesia Transfer of Care Note  Patient: Levi Koch  Procedure(s) Performed: Sublaminar decompression - Lumbar Three-Four/Lumbar Four-Five (Back)  Patient Location: PACU  Anesthesia Type:General  Level of Consciousness: awake, drowsy, and patient cooperative  Airway & Oxygen Therapy: Patient Spontanous Breathing and Patient connected to face mask oxygen  Post-op Assessment: Report given to RN, Post -op Vital signs reviewed and stable, and Patient moving all extremities X 4  Post vital signs: Reviewed and stable  Last Vitals:  Vitals Value Taken Time  BP    Temp 36.1 C 04/10/22 1351  Pulse 65 04/10/22 1352  Resp 21 04/10/22 1352  SpO2 100 % 04/10/22 1352  Vitals shown include unvalidated device data.  Last Pain:  Vitals:   04/10/22 0835  TempSrc:   PainSc: 0-No pain         Complications: No notable events documented.

## 2022-04-11 ENCOUNTER — Encounter (HOSPITAL_COMMUNITY): Payer: Self-pay | Admitting: Neurosurgery

## 2022-04-11 DIAGNOSIS — M48062 Spinal stenosis, lumbar region with neurogenic claudication: Secondary | ICD-10-CM | POA: Diagnosis not present

## 2022-04-11 LAB — GLUCOSE, CAPILLARY: Glucose-Capillary: 154 mg/dL — ABNORMAL HIGH (ref 70–99)

## 2022-04-11 MED ORDER — HYDROCODONE-ACETAMINOPHEN 5-325 MG PO TABS: 2.0000 | ORAL_TABLET | ORAL | 0 refills | Status: DC | PRN

## 2022-04-11 MED ORDER — HYDROCODONE-ACETAMINOPHEN 5-325 MG PO TABS: 2.0000 | ORAL_TABLET | ORAL | 0 refills | Status: AC | PRN

## 2022-04-11 MED FILL — Thrombin For Soln 5000 Unit: CUTANEOUS | Qty: 2 | Status: AC

## 2022-04-11 NOTE — Discharge Instructions (Signed)
Wound Care  Keep the incision clean and dry remove the outer dressing in 2 days, leave the Steri-Strips intact.  Do not put any creams, lotions, or ointments on incision. Leave steri-strips on back.  They will fall off by themselves.  Activity Walk each and every day, increasing distance each day. No lifting greater than 5 lbs.  No lifting no bending no twisting no driving or riding a car unless coming back and forth to see me. If provided with back brace, wear when out of bed.  It is not necessary to wear brace in bed. Diet Resume your normal diet.   Return to Work Will be discussed at you follow up appointment.  Call Your Doctor If Any of These Occur Redness, drainage, or swelling at the wound.  Temperature greater than 101 degrees. Severe pain not relieved by pain medication. Incision starts to come apart. Follow Up Appt Call  [phone])  for problems.  If you have any hardware placed in your spine, you will need an x-ray before your appointment.

## 2022-04-11 NOTE — Evaluation (Signed)
Occupational Therapy Evaluation Patient Details Name: Levi Koch MRN: [id number] DOB: 13-Aug-1945 Today's Date: 04/11/2022   History of Present Illness Bernhard Speak Lizama is a 77 yo male who underwent  Decompressive laminectomy L3-4 L4-5 3/4. PMHx: arthritis, cancer, CKD, DM II, HTN   Clinical Impression   Nester was evaluated s/p the above spine surgery. He is mod I at baseline with intermittent use of SPC. Upon evaluation he was mildly limited by expected back pain, back precautions, decreased activity tolerance and unsteady balance. Overall he requires up to min G for mobility and ADLs with RW. Provided cues and education on spinal precautions and compensatory techniques throughout, handout provided and pt demonstrated great recall. Pt does not require further acute OT services. Recommend d/c home with support of family.        Recommendations for follow up therapy are one component of a multi-disciplinary discharge planning process, led by the attending physician.  Recommendations may be updated based on patient status, additional functional criteria and insurance authorization.   Follow Up Recommendations  No OT follow up     Assistance Recommended at Discharge Intermittent Supervision/Assistance  Patient can return home with the following A little help with walking and/or transfers;A little help with bathing/dressing/bathroom;Assistance with cooking/housework;Assist for transportation;Help with stairs or ramp for entrance    Functional Status Assessment  Patient has had a recent decline in their functional status and demonstrates the ability to make significant improvements in function in a reasonable and predictable amount of time.  Equipment Recommendations  Other (comment) (RW)       Precautions / Restrictions Precautions Precautions: Fall Precaution Comments: no brace needed Restrictions Weight Bearing Restrictions: No      Mobility Bed Mobility Overal bed  mobility: Needs Assistance Bed Mobility: Rolling, Sidelying to Sit Rolling: Supervision Sidelying to sit: Supervision       General bed mobility comments: cues for log roll    Transfers Overall transfer level: Needs assistance Equipment used: Rolling walker (2 wheels) Transfers: Sit to/from Stand Sit to Stand: Min guard           General transfer comment: progressed to supervision A, benefits from elevated surface      Balance Overall balance assessment: Needs assistance Sitting-balance support: Feet supported Sitting balance-Leahy Scale: Good     Standing balance support: Single extremity supported, During functional activity Standing balance-Leahy Scale: Fair                             ADL either performed or assessed with clinical judgement   ADL Overall ADL's : Needs assistance/impaired Eating/Feeding: Independent   Grooming: Supervision/safety;Standing   Upper Body Bathing: Set up;Sitting   Lower Body Bathing: Min guard;Sit to/from stand   Upper Body Dressing : Set up;Sitting   Lower Body Dressing: Min guard;Sit to/from stand   Toilet Transfer: Supervision/safety;Rolling walker (2 wheels);Ambulation;Regular Toilet   Toileting- Clothing Manipulation and Hygiene: Supervision/safety;Sitting/lateral lean       Functional mobility during ADLs: Supervision/safety;Rolling walker (2 wheels) General ADL Comments: cues provided for back precuations, no physical assist required     Vision Baseline Vision/History: 1 Wears glasses Vision Assessment?: No apparent visual deficits     Perception Perception Perception Tested?: No   Praxis Praxis Praxis tested?: Within functional limits    Pertinent Vitals/Pain Pain Assessment Pain Assessment: Faces Faces Pain Scale: Hurts little more Pain Location: sx site Pain Descriptors / Indicators: Discomfort Pain Intervention(s): Limited activity within  patient's tolerance, Monitored during session      Hand Dominance Right   Extremity/Trunk Assessment Upper Extremity Assessment Upper Extremity Assessment: Overall WFL for tasks assessed   Lower Extremity Assessment Lower Extremity Assessment: Overall WFL for tasks assessed   Cervical / Trunk Assessment Cervical / Trunk Assessment: Back Surgery   Communication Communication Communication: No difficulties   Cognition Arousal/Alertness: Awake/alert Behavior During Therapy: WFL for tasks assessed/performed Overall Cognitive Status: Within Functional Limits for tasks assessed                                 General Comments: good recall of back precautions     General Comments  VSS on RA            Home Living Family/patient expects to be discharged to:: Private residence Living Arrangements: Spouse/significant other Available Help at Discharge: Family;Available 24 hours/day Type of Home: House Home Access: Stairs to enter [employer] of Steps: 4 Entrance Stairs-Rails: Right;Left Home Layout: Two level;Bed/bath upstairs Alternate Level Stairs-Number of Steps: flight Alternate Level Stairs-Rails: Right Bathroom Shower/Tub: Occupational psychologist: Handicapped height     Home Equipment: Cane - single point;Shower seat;Grab bars - toilet   Additional Comments: has lift chair on main level - can stay downstairs if needed      Prior Functioning/Environment Prior Level of Function : Independent/Modified Independent;Driving             Mobility Comments: SPC as needed ADLs Comments: indep, drives        OT Problem List: Decreased strength;Decreased range of motion;Decreased activity tolerance;Impaired balance (sitting and/or standing);Decreased safety awareness;Decreased knowledge of use of DME or AE;Decreased knowledge of precautions;Pain         OT Goals(Current goals can be found in the care plan section) Acute Rehab OT Goals Patient Stated Goal: home OT Goal  Formulation: With patient Time For Goal Achievement: 04/11/22 Potential to Achieve Goals: Good   AM-PAC OT "6 Clicks" Daily Activity     Outcome Measure Help from another person eating meals?: None Help from another person taking care of personal grooming?: A Little Help from another person toileting, which includes using toliet, bedpan, or urinal?: A Little Help from another person bathing (including washing, rinsing, drying)?: A Little Help from another person to put on and taking off regular upper body clothing?: None Help from another person to put on and taking off regular lower body clothing?: A Little 6 Click Score: 20   End of Session Equipment Utilized During Treatment: Rolling walker (2 wheels) Nurse Communication: Mobility status  Activity Tolerance: Patient tolerated treatment well Patient left: in bed;with call bell/phone within reach  OT Visit Diagnosis: Unsteadiness on feet (R26.81);Other abnormalities of gait and mobility (R26.89);Muscle weakness (generalized) (M62.81);Pain                Time: 0822-0849 OT Time Calculation (min): 27 min Charges:  OT General Charges $OT Visit: 1 Visit OT Evaluation $OT Eval Moderate Complexity: 1 Mod OT Treatments $Self Care/Home Management : 8-22 mins  Shade Flood, OTR/L Acute Rehabilitation Services Office [phone] Secure Chat Communication Preferred   Elliot Cousin 04/11/2022, 9:27 AM

## 2022-04-11 NOTE — Discharge Summary (Signed)
Physician Discharge Summary  Patient ID: Levi Koch MRN: [id number] DOB/AGE: 14-Jan-1946 77 y.o. Estimated body mass index is 33.12 kg/m as calculated from the following:   Height as of this encounter: '6\' 3"'$  (1.905 m).   Weight as of this encounter: 120.2 kg.   Admit date: 04/10/2022 Discharge date: 04/11/2022  Admission Diagnoses: Lumbar spinal stenosis  Discharge Diagnoses: Same L3-4 L4-5 Principal Problem:   Spinal stenosis of lumbar region   Discharged Condition: good  Hospital Course: Patient was admitted to hospital underwent the aforementioned procedure postoperative patient did very well with covering the floor on the floor was ambulating and voiding spontaneously tolerating regular diet and stable for discharge home.  Patient will be discharged with scheduled follow-up in 1 to 2 weeks.  Consults: Significant Diagnostic Studies:  Treatments: Decompressive laminectomy L3-4 L4-5 Discharge Exam: Blood pressure 126/71, pulse 72, temperature 97.9 F (36.6 C), temperature source Oral, resp. rate 18, height '6\' 3"'$  (1.905 m), weight 120.2 kg, SpO2 100 %. Strength 5 out of 5 wound clean dry and intact  Disposition: Home   Allergies as of 04/11/2022   No Known Allergies      Medication List     TAKE these medications    amLODipine 10 MG tablet Commonly known as: NORVASC Take 10 mg by mouth daily.   aspirin EC 81 MG tablet Take 81 mg by mouth daily.   atorvastatin 40 MG tablet Commonly known as: LIPITOR Take 40 mg by mouth at bedtime.   Biktarvy 50-200-25 MG Tabs tablet Generic drug: bictegravir-emtricitabine-tenofovir AF Take 1 tablet by mouth daily.   carboxymethylcellulose 0.5 % Soln Commonly known as: REFRESH PLUS Place 1 drop into both eyes 2 (two) times daily as needed (dry eyes).   carvedilol 25 MG tablet Commonly known as: COREG Take 12.5 mg by mouth 2 (two) times daily.   cetirizine 10 MG tablet Commonly known as: ZYRTEC Take 10 mg by mouth  daily.   clobetasol ointment 0.05 % Commonly known as: TEMOVATE Apply 1 Application topically See admin instructions. Apply topically twice daily for 2 weeks on, 1 week off as needed for flares.   clobetasol 0.05 % external solution Commonly known as: TEMOVATE Apply 1 Application topically See admin instructions. Apply topically twice daily for 2 weeks on, 1 week off as needed for flares. Mix in 16 oz of Cerave moisturizer.   fluocinonide ointment 0.05 % Commonly known as: LIDEX Apply 1 Application topically See admin instructions. Apply topically twice daily for 7 days on, 7 days off. Use as needed for flares.   fluticasone-salmeterol 100-50 MCG/ACT Aepb Commonly known as: ADVAIR Inhale 1 puff into the lungs 2 (two) times daily.   gabapentin 100 MG capsule Commonly known as: NEURONTIN Take 200 mg by mouth 2 (two) times daily.   glipiZIDE 10 MG tablet Commonly known as: GLUCOTROL Take 10 mg by mouth 2 (two) times daily before a meal.   hydrochlorothiazide 25 MG tablet Commonly known as: HYDRODIURIL Take 25 mg by mouth daily.   HYDROcodone-acetaminophen 5-325 MG tablet Commonly known as: NORCO/VICODIN Take 2 tablets by mouth every 4 (four) hours as needed for severe pain ((score 7 to 10)).   hydrOXYzine 10 MG tablet Commonly known as: ATARAX Take 10 mg by mouth 2 (two) times daily as needed for itching.   Jardiance 25 MG Tabs tablet Generic drug: empagliflozin Take 12.5 mg by mouth daily.   ketotifen 0.025 % ophthalmic solution Commonly known as: ZADITOR Place 1 drop into both eyes  2 (two) times daily.   mirtazapine 15 MG tablet Commonly known as: REMERON Take 15 mg by mouth at bedtime as needed (sleep).   sildenafil 100 MG tablet Commonly known as: VIAGRA Take 100 mg by mouth daily as needed for erectile dysfunction.   SOAP & CLEANSERS EX Apply 1 application  topically daily. Cleansing soap bar, sensitive skin.   spironolactone 25 MG tablet Commonly known as:  ALDACTONE Take 25 mg by mouth daily.   Tiotropium Bromide Monohydrate 1.25 MCG/ACT Aers Inhale 2 each into the lungs daily.   white petrolatum ointment Apply 1 application  topically 2 (two) times daily as needed for dry skin.         Signed: Elaina Hoops 04/11/2022, 6:40 AM

## 2022-04-11 NOTE — Progress Notes (Signed)
Patient alert and oriented, mae's well, voiding adequate amount of urine, swallowing without difficulty, no c/o pain at time of discharge. Patient discharged home with family. Script and discharged instructions given to patient. Patient and family stated understanding of instructions given. Patient has an appointment with Dr. Cram 

## 2022-04-12 NOTE — Anesthesia Postprocedure Evaluation (Signed)
Anesthesia Post Note  Patient: Levi Koch  Procedure(s) Performed: Sublaminar decompression - Lumbar Three-Four/Lumbar Four-Five (Back)     Patient location during evaluation: PACU Anesthesia Type: General Level of consciousness: sedated Pain management: pain level controlled Vital Signs Assessment: post-procedure vital signs reviewed and stable Respiratory status: spontaneous breathing and respiratory function stable Cardiovascular status: stable Postop Assessment: no apparent nausea or vomiting Anesthetic complications: no  No notable events documented.                Khiree Bukhari DANIEL
